# Patient Record
Sex: Male | Born: 1995 | Hispanic: Yes | Marital: Single | Smoking: Never smoker
Health system: Southern US, Community
[De-identification: ages and names within clinical notes are randomized; demographics above are authoritative.]

---
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
## 2016-10-29 IMAGING — US US ABDOMEN LIMITED
1 series · 14 of 25 positions shown · non-contrast
Comparison: None.

CLINICAL DATA: Epigastric abdominal pain for 2 days.

EXAM:
US ABDOMEN LIMITED - RIGHT UPPER QUADRANT

[Series 1: us abdomen limited · 0.19mm/px · 14 of 36 slices shown]
[im 1/36]
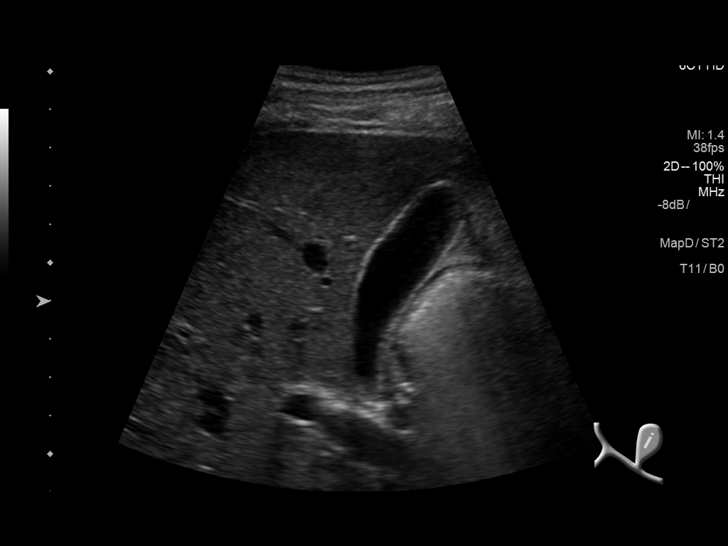
[im 3/36]
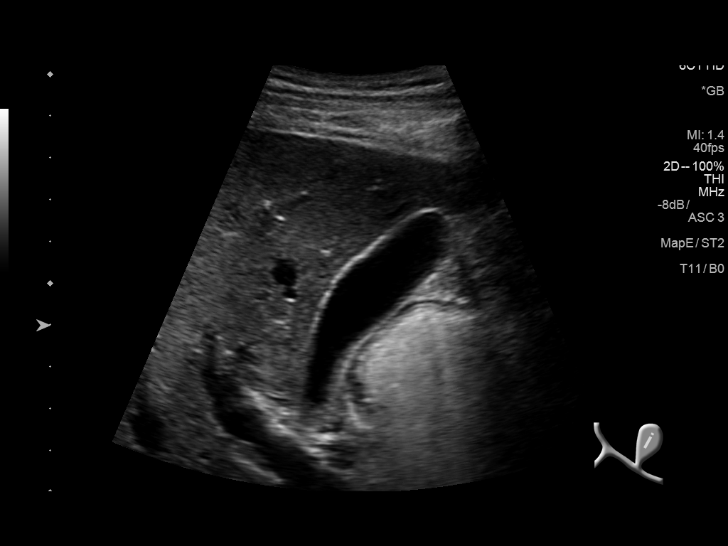
[im 6/36]
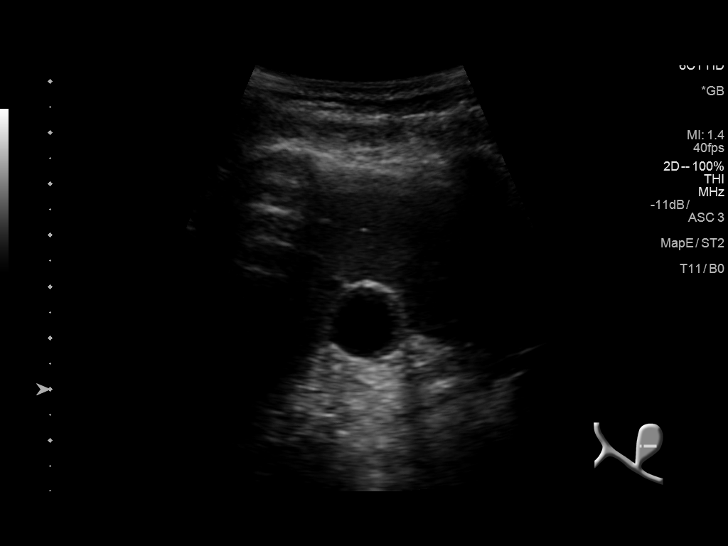
[im 9/36]
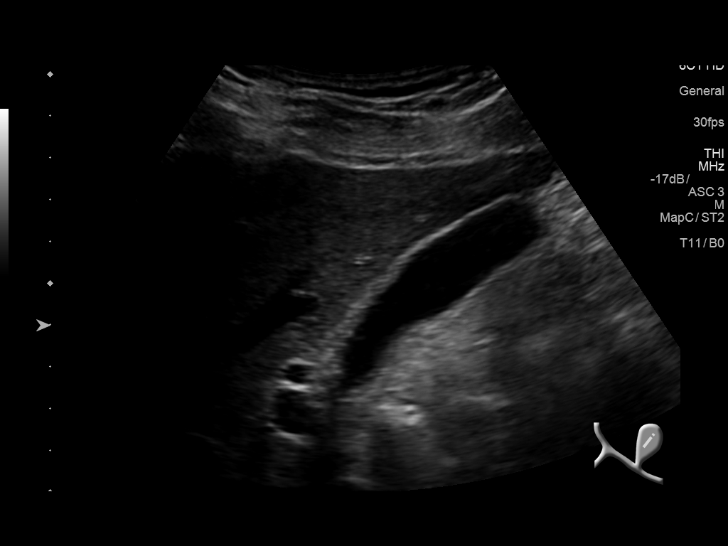
[im 12/36]
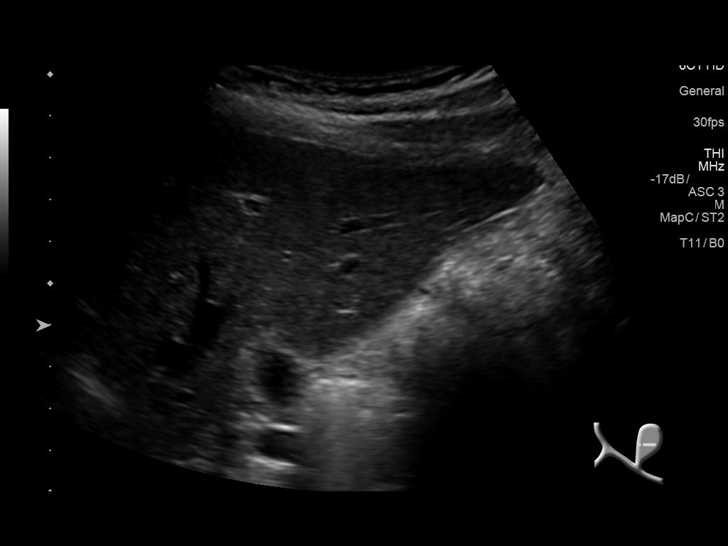
[im 14/36]
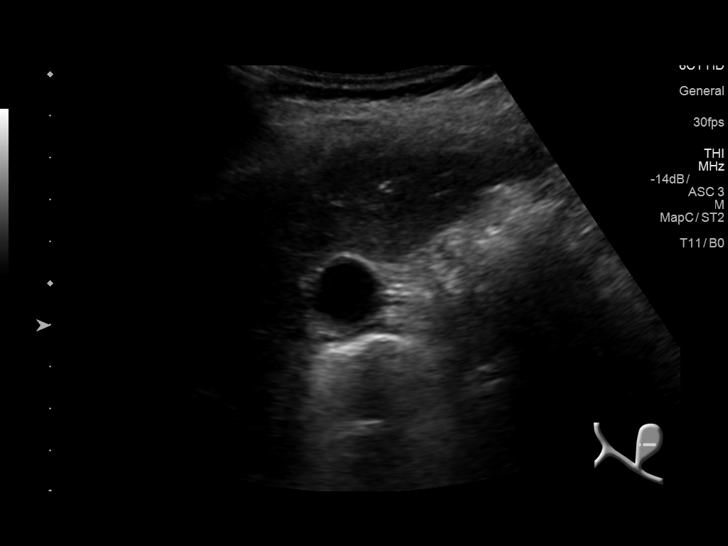
[im 17/36]
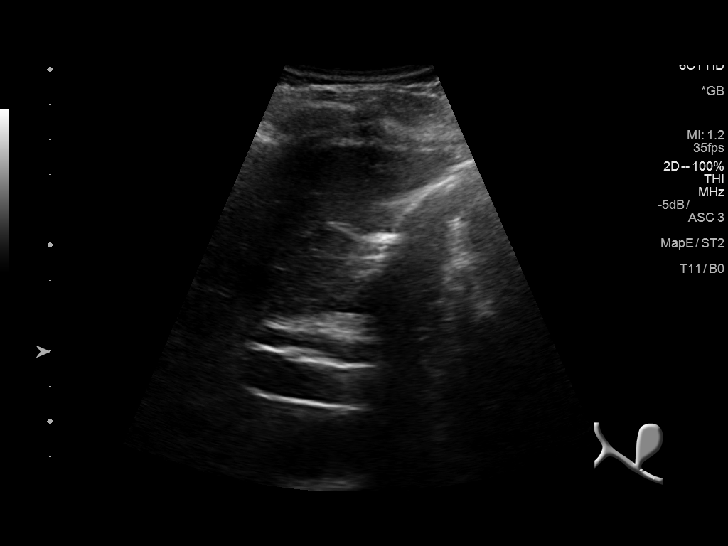
[im 19/36]
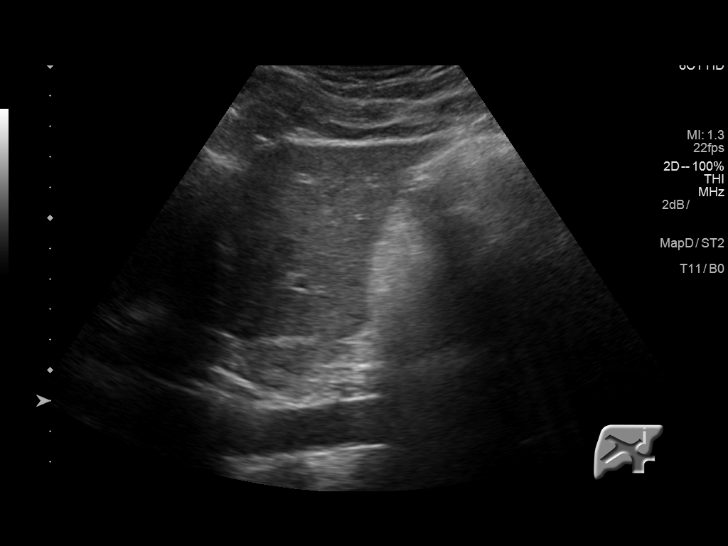
[im 22/36]
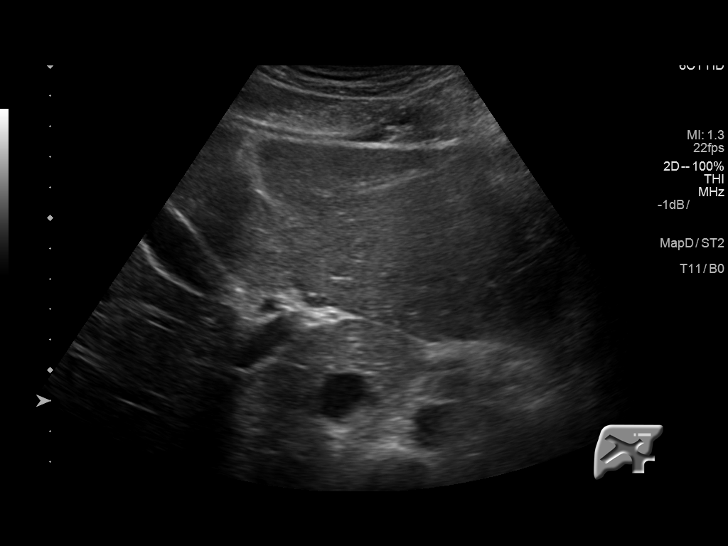
[im 24/36]
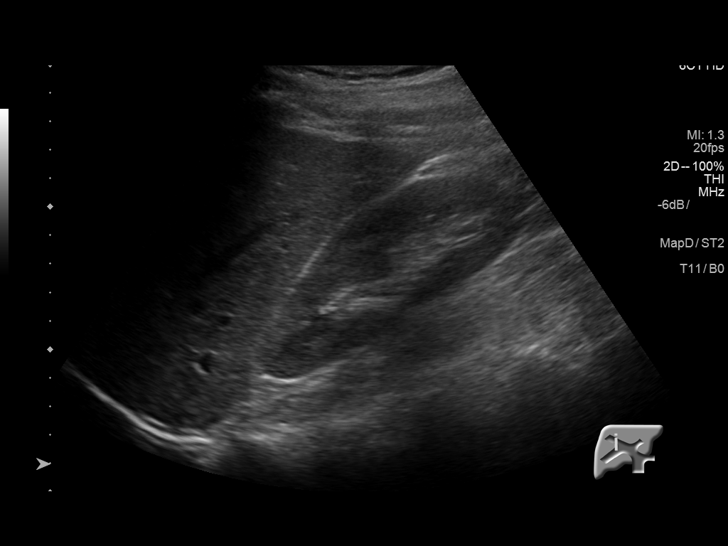
[im 27/36]
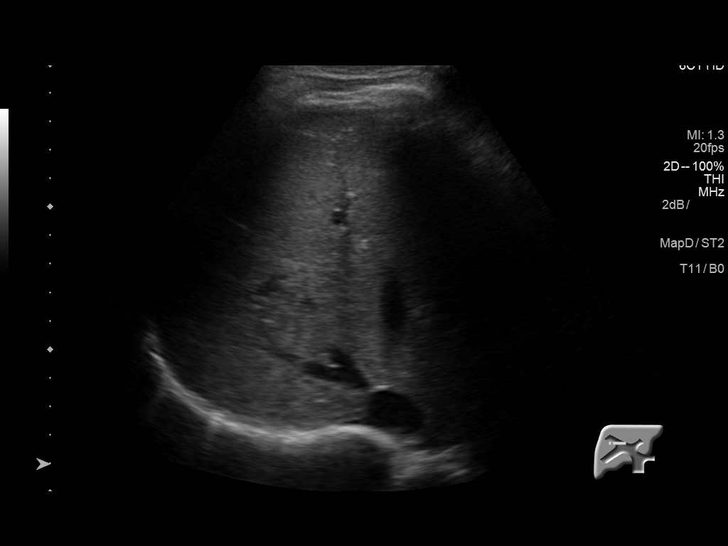
[im 30/36]
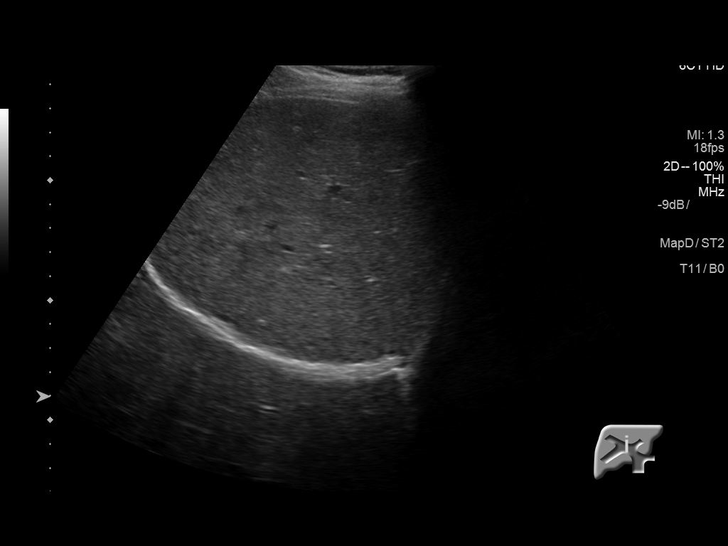
[im 33/36]
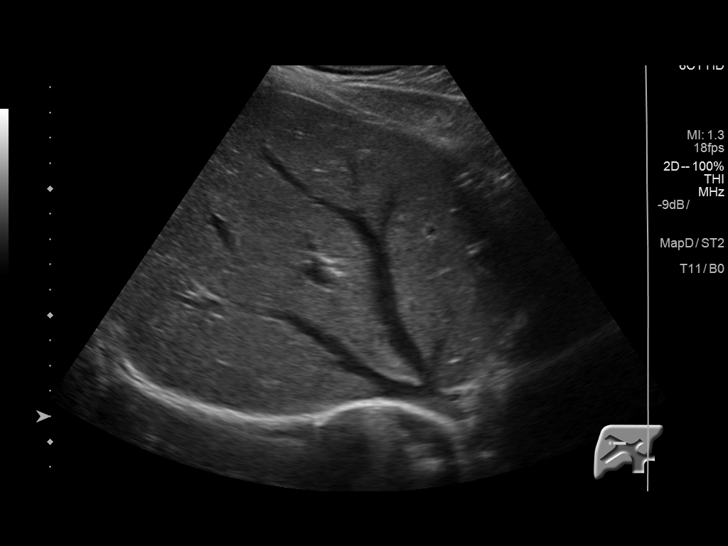
[im 36/36]
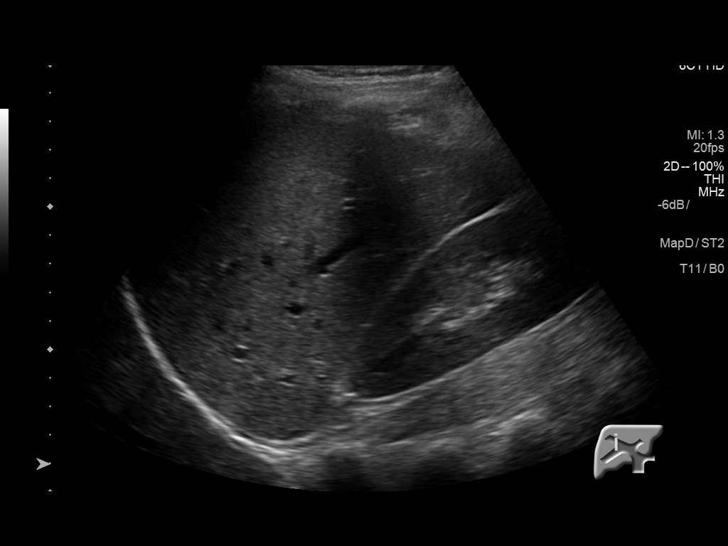

[14 of 25 positions shown; findings below may reference images not displayed]

FINDINGS: Gallbladder:

Physiologically distended. No gallstones or wall thickening
visualized. No sonographic Murphy sign noted.

Common bile duct:

Diameter: 5.9 mm, prominent

Liver:

No focal lesion identified. Within normal limits in parenchymal
echogenicity. Normal directional flow in the main portal vein.
IMPRESSION: 1. Mild biliary prominence with common bile duct measuring 5.9 mm.
The gallbladder appears normal without abnormal distension or
cholelithiasis. Significance of this is uncertain.
2. Normal sonographic appearance of the liver.
